# Patient Record
Sex: Female | Born: 1945 | Race: Black or African American | Hispanic: No | Marital: Married
Health system: Southern US, Community
[De-identification: ages and names within clinical notes are randomized; demographics above are authoritative.]

## PROBLEM LIST (undated history)

## (undated) DIAGNOSIS — I10 Essential (primary) hypertension: Secondary | ICD-10-CM

## (undated) HISTORY — DX: Essential (primary) hypertension: I10

## (undated) HISTORY — PX: ABDOMINAL HYSTERECTOMY: SHX81

## (undated) HISTORY — PX: GALLBLADDER SURGERY: SHX652

---

## 2003-07-25 ENCOUNTER — Other Ambulatory Visit: Payer: Self-pay

## 2004-11-25 ENCOUNTER — Ambulatory Visit: Payer: Self-pay | Admitting: Family Medicine

## 2005-11-28 ENCOUNTER — Ambulatory Visit: Payer: Self-pay | Admitting: Family Medicine

## 2007-09-04 ENCOUNTER — Emergency Department: Payer: Self-pay | Admitting: Emergency Medicine

## 2008-03-24 ENCOUNTER — Ambulatory Visit: Payer: Self-pay | Admitting: Family Medicine

## 2009-04-13 ENCOUNTER — Ambulatory Visit: Payer: Self-pay | Admitting: Family Medicine

## 2010-04-27 ENCOUNTER — Ambulatory Visit: Payer: Self-pay | Admitting: Family

## 2011-04-23 ENCOUNTER — Ambulatory Visit: Payer: Self-pay | Admitting: Internal Medicine

## 2011-04-23 LAB — WET PREP, GENITAL

## 2011-04-23 LAB — URINALYSIS, COMPLETE
Bilirubin,UR: NEGATIVE
Blood: NEGATIVE
Glucose,UR: NEGATIVE mg/dL (ref 0–75)
Ketone: NEGATIVE
Leukocyte Esterase: NEGATIVE
Nitrite: NEGATIVE
Ph: 5 (ref 4.5–8.0)
Specific Gravity: 1.01 (ref 1.003–1.030)

## 2011-04-25 LAB — URINE CULTURE

## 2011-05-04 ENCOUNTER — Ambulatory Visit: Payer: Self-pay | Admitting: Family

## 2011-05-18 ENCOUNTER — Emergency Department: Payer: Self-pay | Admitting: *Deleted

## 2011-05-18 LAB — URINALYSIS, COMPLETE
Bilirubin,UR: NEGATIVE
Blood: NEGATIVE
Leukocyte Esterase: NEGATIVE
Ph: 6 (ref 4.5–8.0)
RBC,UR: 1 /HPF (ref 0–5)
Specific Gravity: 1.004 (ref 1.003–1.030)
Squamous Epithelial: <1
WBC UR: 1 /HPF (ref 0–5)

## 2011-05-18 LAB — COMPREHENSIVE METABOLIC PANEL
Alkaline Phosphatase: 87 U/L (ref 50–136)
Anion Gap: 11 (ref 7–16)
BUN: 10 mg/dL (ref 7–18)
Chloride: 104 mmol/L (ref 98–107)
Co2: 28 mmol/L (ref 21–32)
Glucose: 91 mg/dL (ref 65–99)
Osmolality: 284 (ref 275–301)
Potassium: 4.3 mmol/L (ref 3.5–5.1)
SGOT(AST): 37 U/L (ref 15–37)
SGPT (ALT): 18 U/L
Total Protein: 8.6 g/dL — ABNORMAL HIGH (ref 6.4–8.2)

## 2011-05-18 LAB — CBC
HCT: 43.9 % (ref 35.0–47.0)
MCH: 27.7 pg (ref 26.0–34.0)
MCHC: 32.3 g/dL (ref 32.0–36.0)
RBC: 5.12 10*6/uL (ref 3.80–5.20)
RDW: 14.2 % (ref 11.5–14.5)
WBC: 6.4 10*3/uL (ref 3.6–11.0)

## 2011-05-18 LAB — TSH: Thyroid Stimulating Horm: 0.274 u[IU]/mL — ABNORMAL LOW

## 2011-05-18 LAB — TROPONIN I: Troponin-I: <0.02 ng/mL

## 2011-05-18 LAB — LIPASE, BLOOD: Lipase: 105 U/L (ref 73–393)

## 2011-07-09 ENCOUNTER — Emergency Department: Payer: Self-pay | Admitting: Unknown Physician Specialty

## 2011-07-09 LAB — COMPREHENSIVE METABOLIC PANEL
Alkaline Phosphatase: 84 U/L (ref 50–136)
Anion Gap: 7 (ref 7–16)
BUN: 9 mg/dL (ref 7–18)
Bilirubin,Total: 0.3 mg/dL (ref 0.2–1.0)
Chloride: 105 mmol/L (ref 98–107)
Creatinine: 0.92 mg/dL (ref 0.60–1.30)
EGFR (African American): >60
EGFR (Non-African Amer.): >60
Glucose: 79 mg/dL (ref 65–99)
Potassium: 3.2 mmol/L — ABNORMAL LOW (ref 3.5–5.1)
SGPT (ALT): 16 U/L
Sodium: 145 mmol/L (ref 136–145)

## 2011-07-09 LAB — CK TOTAL AND CKMB (NOT AT ARMC)
CK, Total: 118 U/L (ref 21–215)
CK-MB: 1 ng/mL (ref 0.5–3.6)

## 2011-07-09 LAB — CBC
HCT: 44 % (ref 35.0–47.0)
MCH: 27.8 pg (ref 26.0–34.0)
WBC: 5.5 10*3/uL (ref 3.6–11.0)

## 2011-07-09 LAB — TROPONIN I: Troponin-I: <0.02 ng/mL

## 2012-05-18 ENCOUNTER — Ambulatory Visit: Payer: Self-pay | Admitting: Internal Medicine

## 2013-05-23 ENCOUNTER — Ambulatory Visit: Payer: Self-pay | Admitting: Internal Medicine

## 2013-12-02 ENCOUNTER — Ambulatory Visit (INDEPENDENT_AMBULATORY_CARE_PROVIDER_SITE_OTHER): Payer: 59

## 2013-12-02 ENCOUNTER — Encounter: Payer: Self-pay | Admitting: Podiatry

## 2013-12-02 ENCOUNTER — Ambulatory Visit (INDEPENDENT_AMBULATORY_CARE_PROVIDER_SITE_OTHER): Payer: 59 | Admitting: Podiatry

## 2013-12-02 VITALS — BP 166/89 | HR 60 | Resp 16 | Ht 63.0 in | Wt 155.0 lb

## 2013-12-02 DIAGNOSIS — M722 Plantar fascial fibromatosis: Secondary | ICD-10-CM

## 2013-12-02 MED ORDER — METHYLPREDNISOLONE (PAK) 4 MG PO TABS: ORAL_TABLET | ORAL | Status: AC

## 2013-12-02 MED ORDER — MELOXICAM 15 MG PO TABS
15.0000 mg | ORAL_TABLET | Freq: Every day | ORAL | Status: AC
Start: 2013-12-02 — End: ?

## 2013-12-02 NOTE — Progress Notes (Signed)
   Subjective:    Patient ID: Nicole Griffith, female    DOB: [birth year], 68 y.o.   MRN: [id number]  HPI Comments: Right plantar heel pain, real sore when walking on it, its making my ankle swell . It has been going on for about a month now   Foot Pain Associated symptoms include abdominal pain.      Review of Systems  Eyes: Positive for redness and itching.  Cardiovascular: Positive for palpitations.  Gastrointestinal: Positive for abdominal pain and constipation.  Genitourinary: Positive for urgency.  All other systems reviewed and are negative.      Objective:   Physical Exam: I have reviewed her past medical history medications allergies surgeries social history and review of systems. Pulses are strongly palpable bilateral. Neurologic sensorium is intact per since once the monofilament. Deep tendon reflexes are intact bilateral muscle strength is 5 over 5 dorsiflexors plantar flexors inverters everters all intrinsic musculature is intact. Orthopedic evaluation demonstrates all joints distal to the ankle a full range of motion without crepitation. She has pain on palpation medial continued tubercle of her right heel. Radiographic evaluation does demonstrate soft tissue increase in density at the plantar fascial calcaneal insertion site of the right heel. Cutaneous evaluation demonstrates supple well hydrated cutis no erythema edema cellulitis drainage or odor.        Assessment & Plan:  Assessment: Plantar fasciitis right foot.  Plan: Discussed etiology quality conservative versus surgical therapies. Injected Kenalog and local anesthetic to the point of maximal tenderness of the right heel today. Medrol Dosepak to be followed by meloxicam. Night splint and a plantar fascial brace were dispensed. We discussed appropriate shoe gear stretching exercises ice therapy shoe gear modifications. We discussed the etiology pathology conservative versus surgical therapies. I will followup with  her in one month.

## 2013-12-02 NOTE — Patient Instructions (Signed)

## 2013-12-30 ENCOUNTER — Ambulatory Visit: Payer: 59 | Admitting: Podiatry

## 2014-05-28 ENCOUNTER — Ambulatory Visit
Admit: 2014-05-28 | Disposition: A | Discharge status: Home / Self Care | Payer: Self-pay | Attending: Internal Medicine | Admitting: Internal Medicine

## 2014-06-04 ENCOUNTER — Other Ambulatory Visit: Payer: Self-pay | Admitting: Internal Medicine

## 2014-06-04 DIAGNOSIS — N63 Unspecified lump in unspecified breast: Secondary | ICD-10-CM

## 2014-06-16 ENCOUNTER — Ambulatory Visit
Admission: RE | Admit: 2014-06-16 | Discharge: 2014-06-16 | Disposition: A | Discharge status: Home / Self Care | Payer: Medicare Other | Source: Ambulatory Visit | Attending: Internal Medicine | Admitting: Internal Medicine

## 2014-06-16 ENCOUNTER — Ambulatory Visit: Admission: RE | Admit: 2014-06-16 | Payer: Medicare Other | Source: Ambulatory Visit

## 2014-06-16 DIAGNOSIS — R928 Other abnormal and inconclusive findings on diagnostic imaging of breast: Secondary | ICD-10-CM | POA: Insufficient documentation

## 2014-06-16 DIAGNOSIS — N63 Unspecified lump in unspecified breast: Secondary | ICD-10-CM

## 2015-05-22 ENCOUNTER — Other Ambulatory Visit: Payer: Self-pay | Admitting: Family Medicine

## 2015-05-22 DIAGNOSIS — Z1231 Encounter for screening mammogram for malignant neoplasm of breast: Secondary | ICD-10-CM

## 2015-06-01 ENCOUNTER — Ambulatory Visit: Admission: RE | Admit: 2015-06-01 | Payer: Medicare Other | Source: Ambulatory Visit

## 2015-06-11 ENCOUNTER — Ambulatory Visit
Admission: RE | Admit: 2015-06-11 | Discharge: 2015-06-11 | Disposition: A | Discharge status: Home / Self Care | Payer: Medicare Other | Source: Ambulatory Visit | Attending: Family Medicine | Admitting: Family Medicine

## 2015-06-11 DIAGNOSIS — Z1231 Encounter for screening mammogram for malignant neoplasm of breast: Secondary | ICD-10-CM | POA: Diagnosis not present

## 2015-07-23 IMAGING — MG MM DIGITAL SCREENING BILAT W/ CAD
1 series · 6 of 6 positions shown · non-contrast
Comparison: Previous exam(s).

CLINICAL DATA: Screening.

EXAM:
DIGITAL SCREENING BILATERAL MAMMOGRAM WITH CAD

[R CC · right · 6 of 6 slices shown]
[im 1/6]
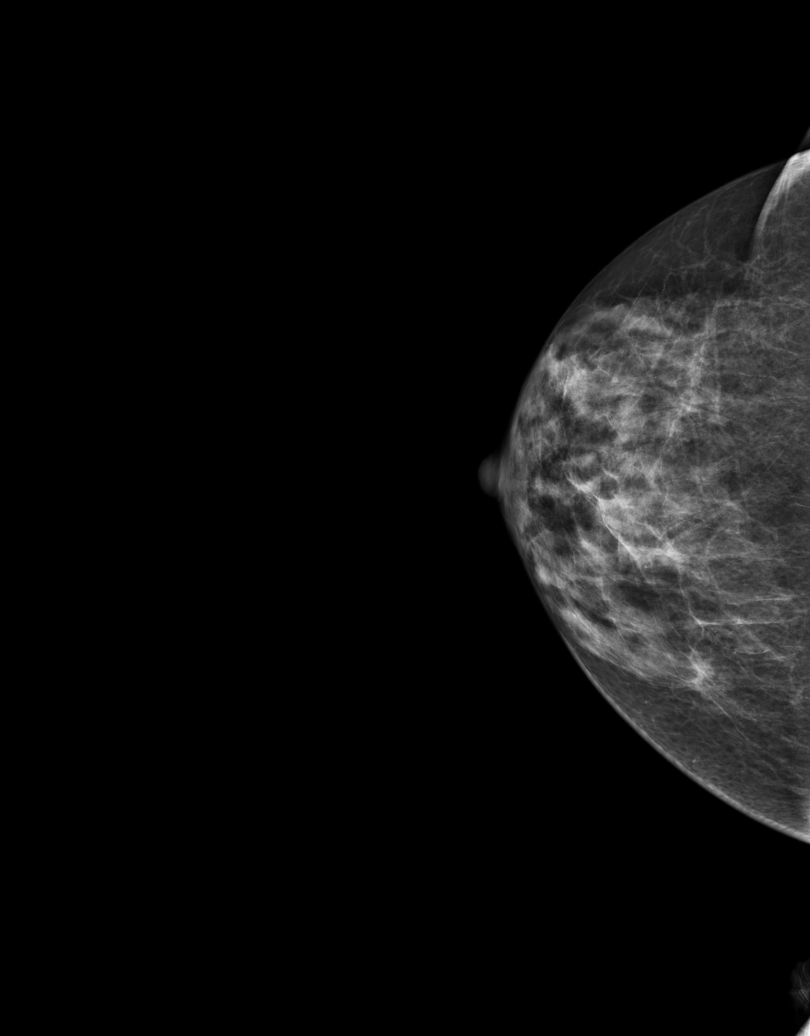
[im 2/6]
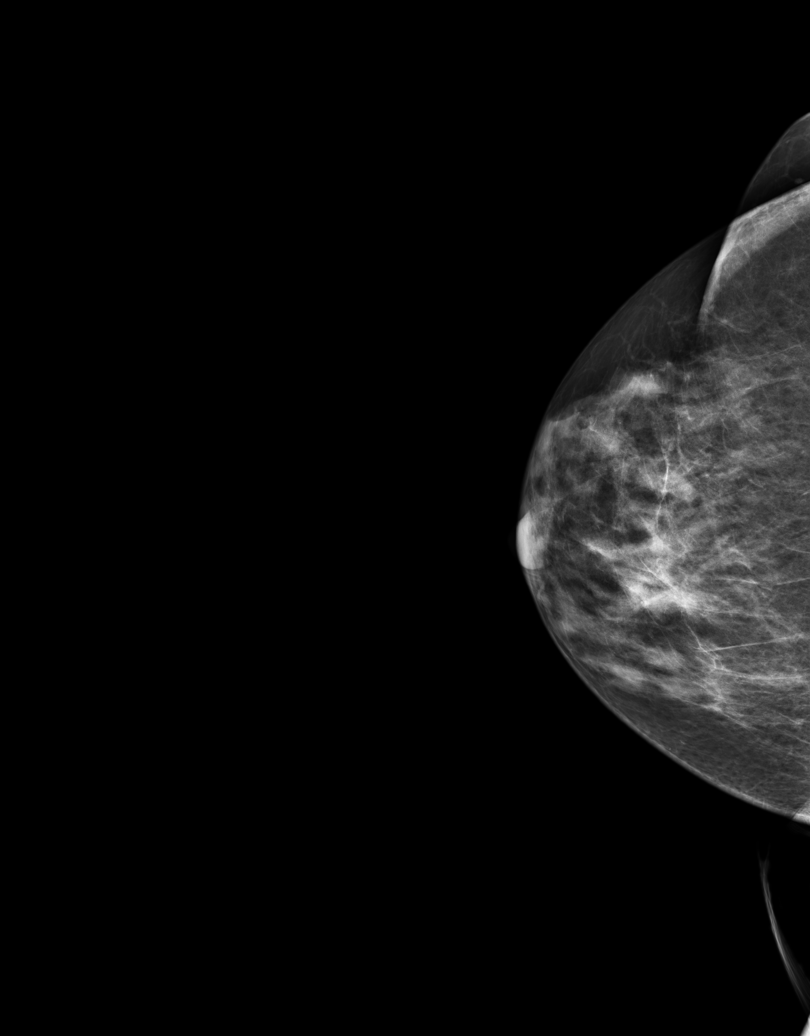
[im 3/6]
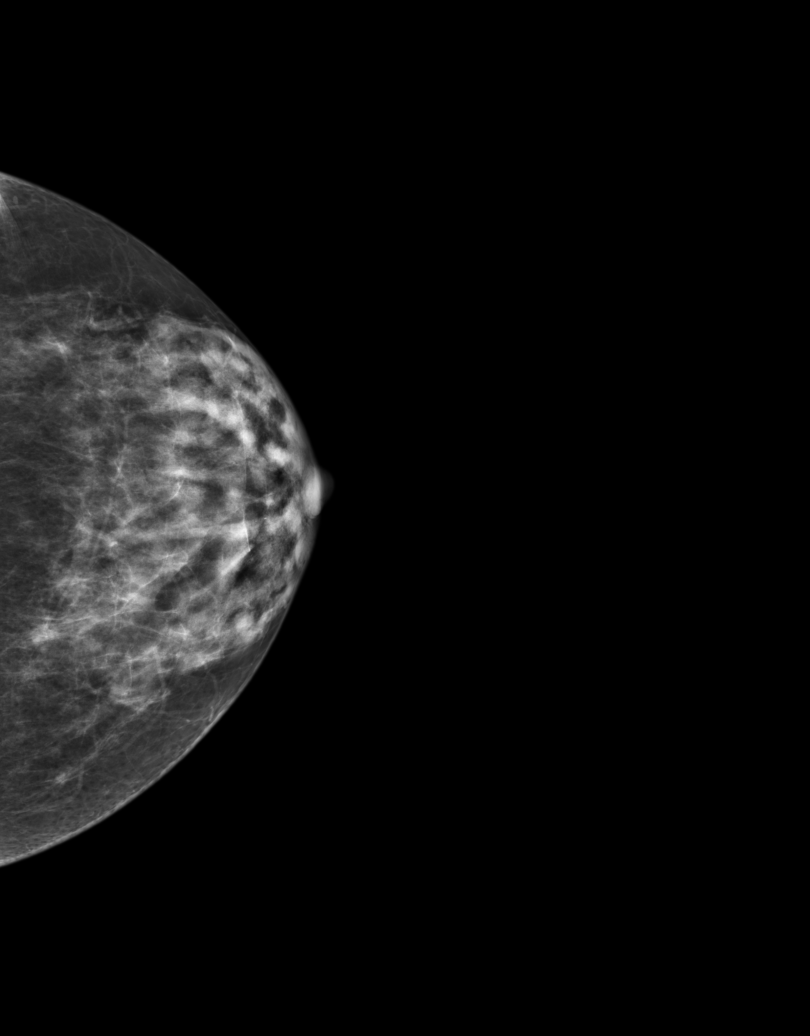
[im 4/6]
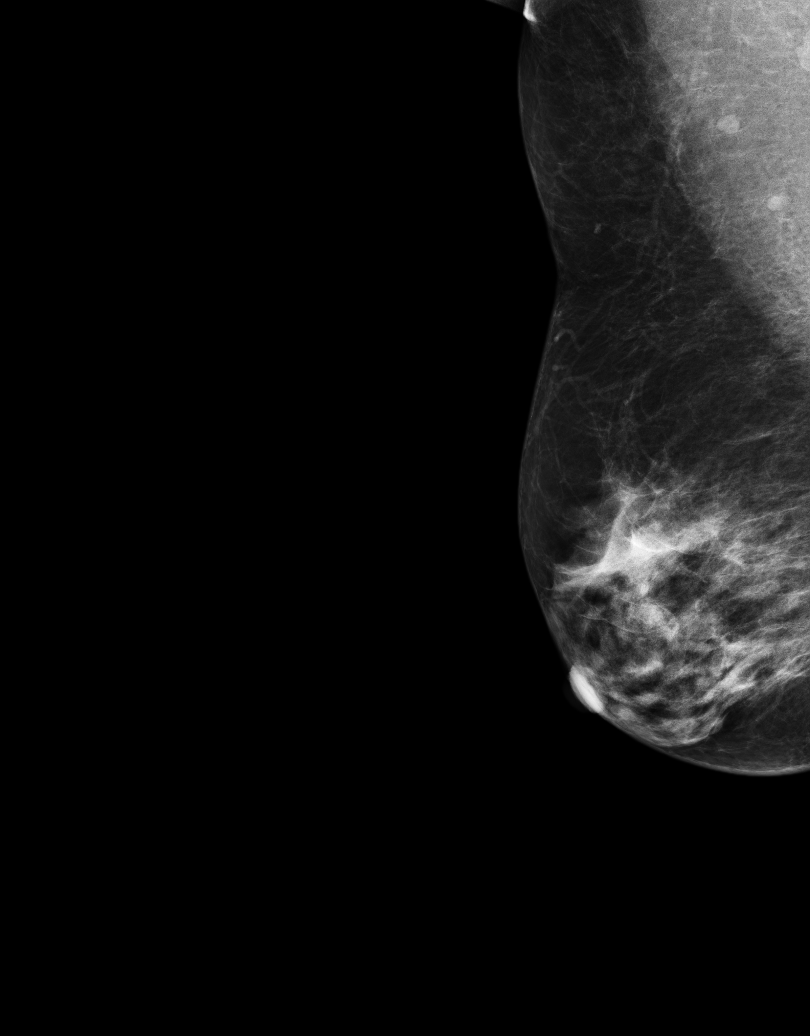
[im 5/6]
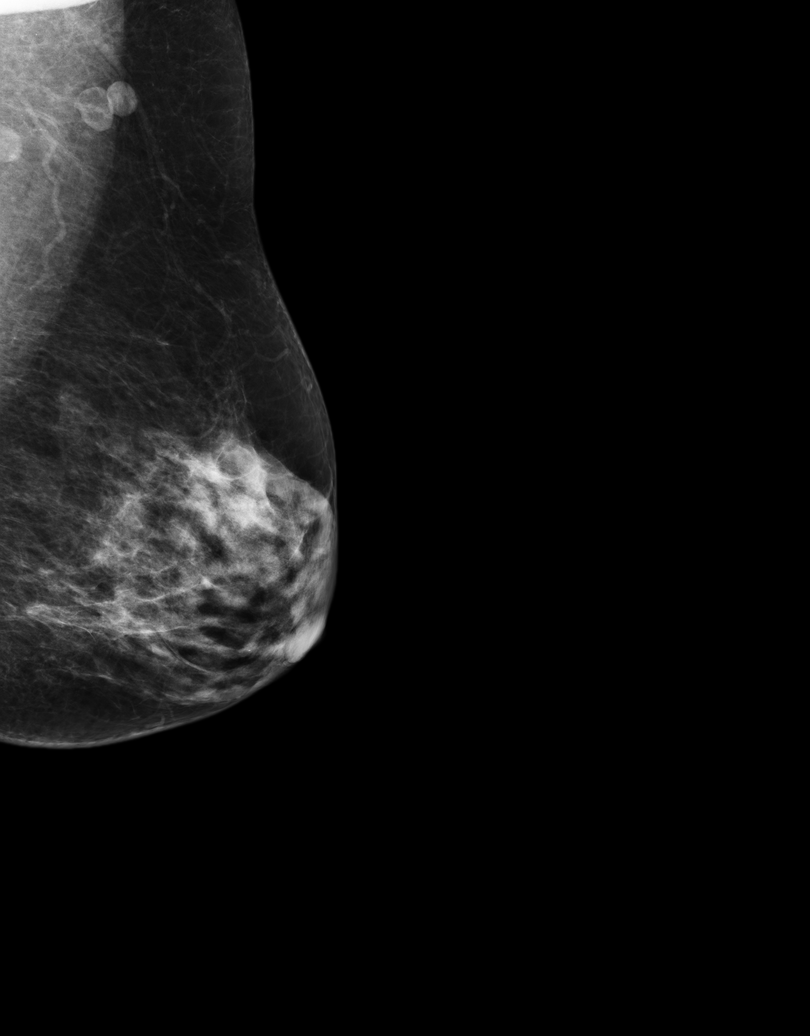
[im 6/6]
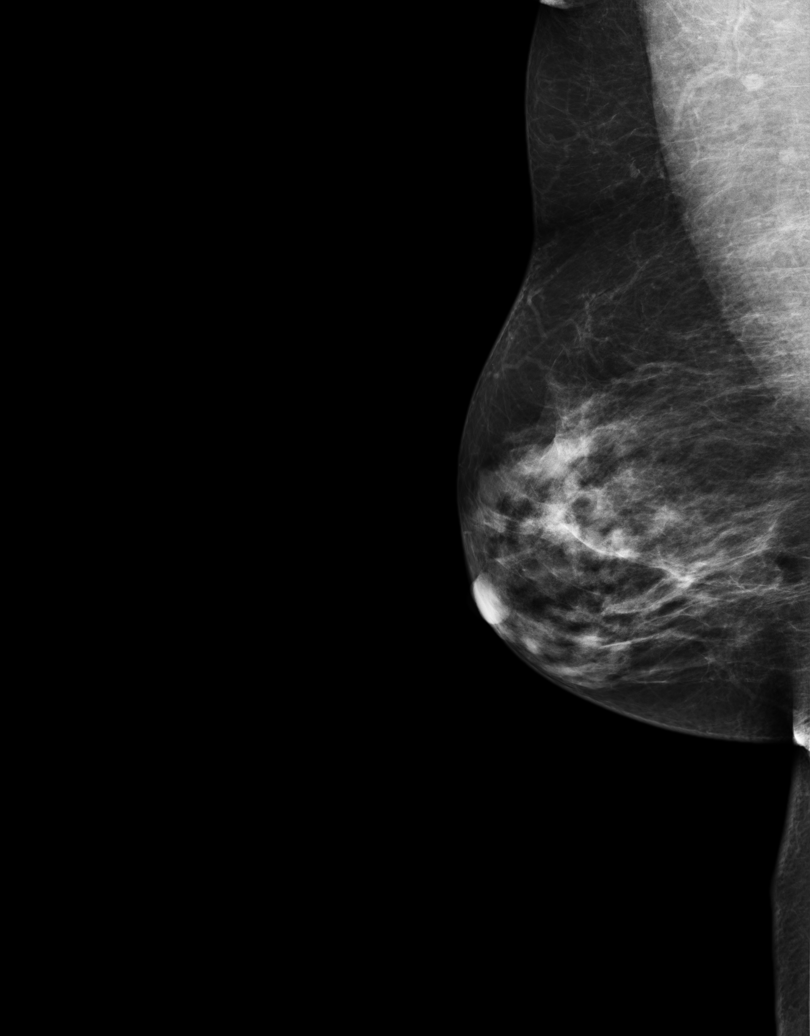

[6 of 6 positions shown; findings below may reference images not displayed]

ACR Breast Density Category c: The breast tissue is heterogeneously
dense, which may obscure small masses.
FINDINGS: In the left breast, a possible mass warrants further evaluation. In
the right breast, no findings suspicious for malignancy.

Images were processed with CAD.
IMPRESSION: Further evaluation is suggested for possible mass in the left
breast.

RECOMMENDATION:
Diagnostic mammogram and possibly ultrasound of the left breast.
(Code:[id number])

The patient will be contacted regarding the findings, and additional
imaging will be scheduled.

BI-RADS CATEGORY  0: Incomplete. Need additional imaging evaluation
and/or prior mammograms for comparison.

## 2016-08-05 IMAGING — MG MM DIGITAL SCREENING BILAT W/ CAD
1 series · 5 of 5 positions shown · non-contrast
Comparison: Previous exam(s).

CLINICAL DATA: Screening.

EXAM:
DIGITAL SCREENING BILATERAL MAMMOGRAM WITH CAD

[R CC · right · 5 of 5 slices shown]
[im 1/5]
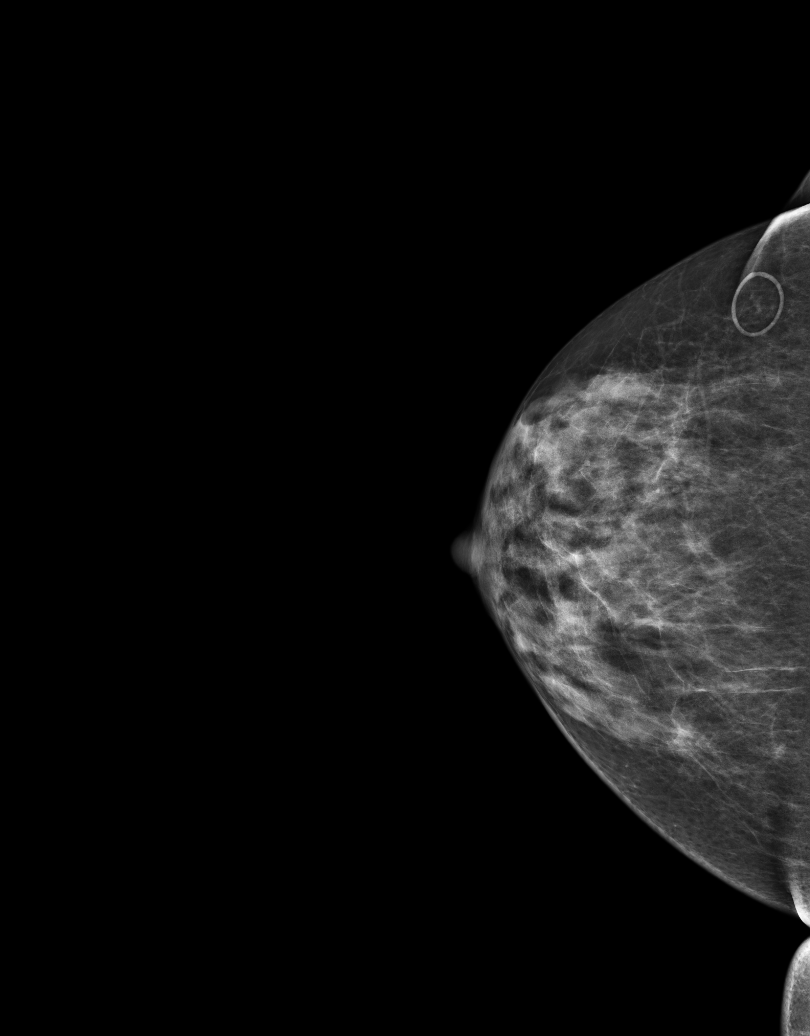
[im 2/5]
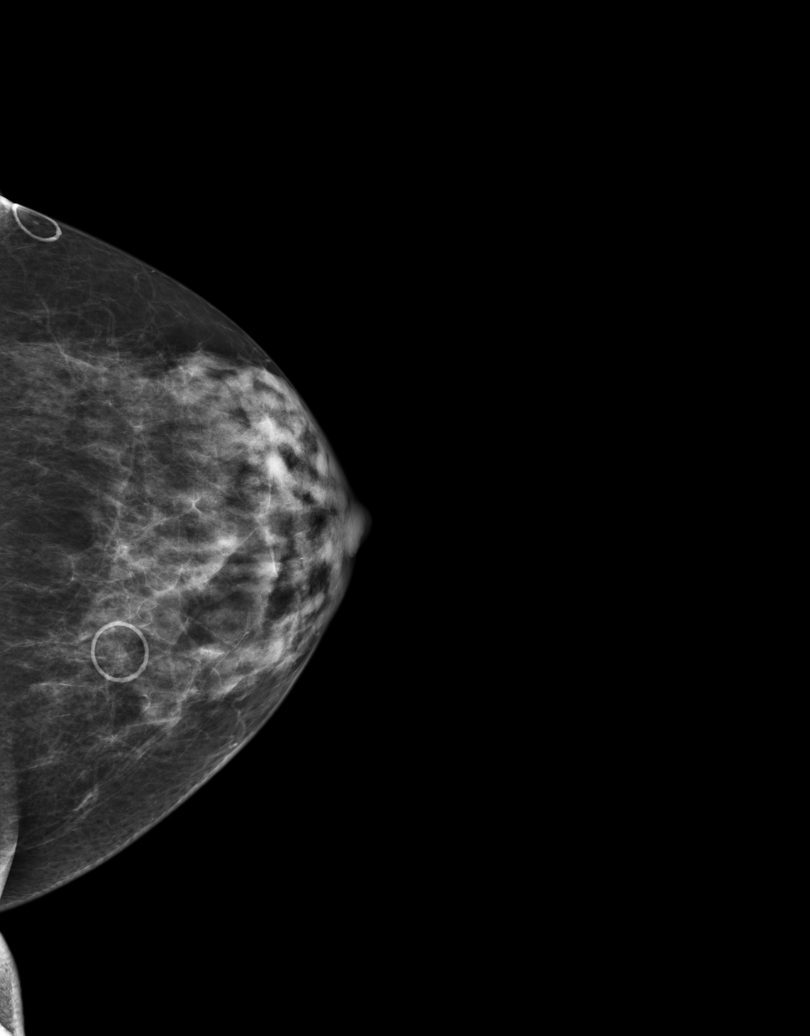
[im 3/5]
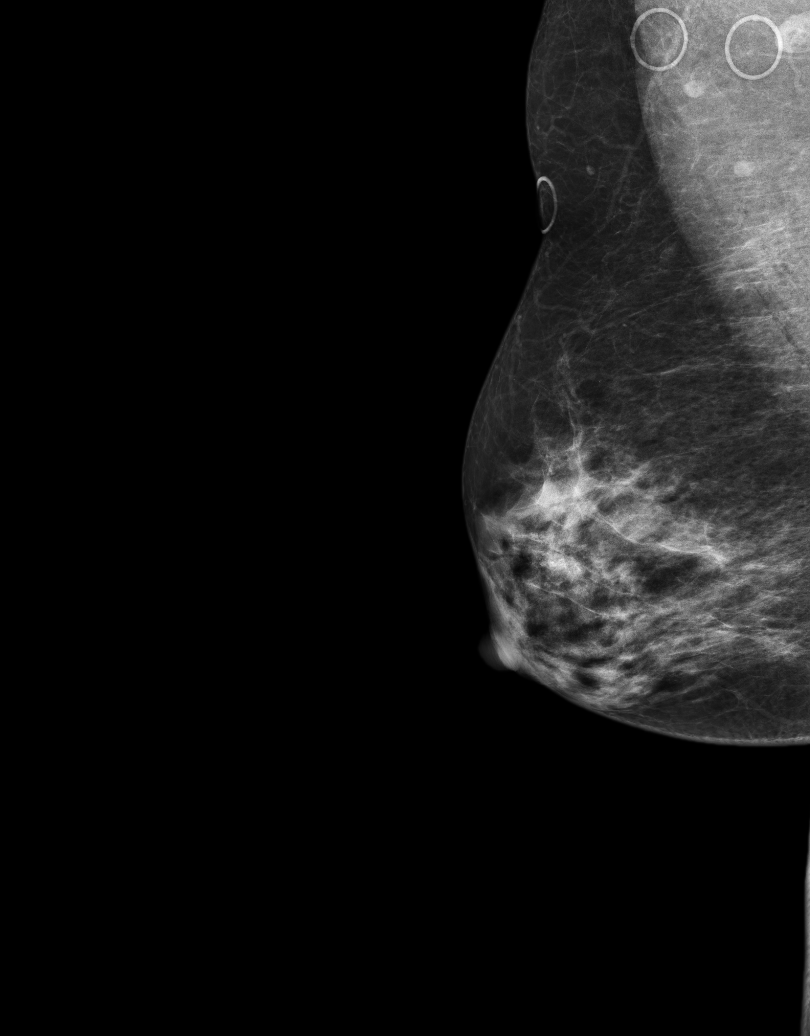
[im 4/5]
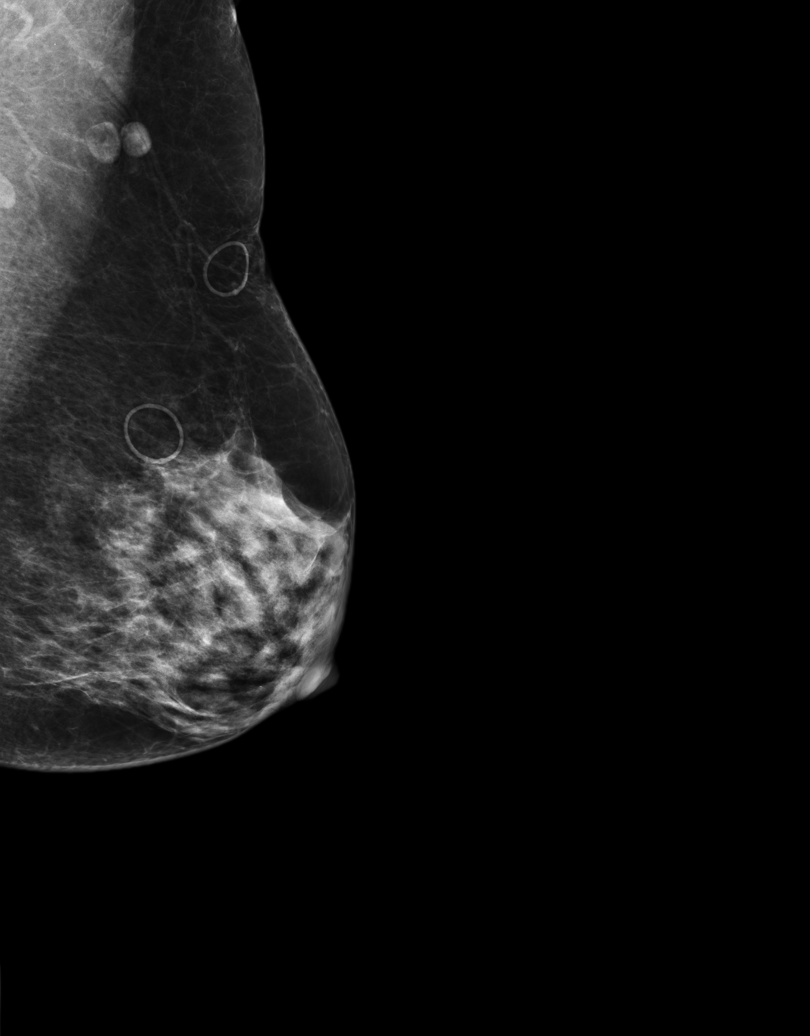
[im 5/5]
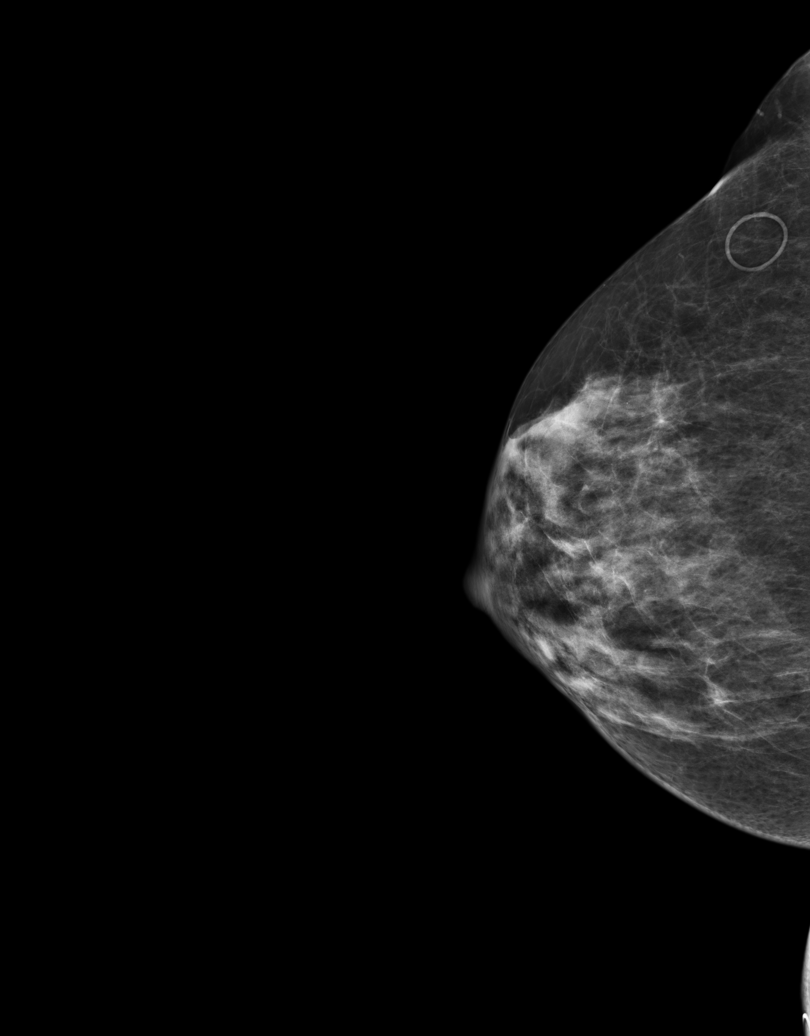

[5 of 5 positions shown; findings below may reference images not displayed]

ACR Breast Density Category c: The breast tissue is heterogeneously
dense, which may obscure small masses.
FINDINGS: There are no findings suspicious for malignancy. Images were
processed with CAD.
IMPRESSION: No mammographic evidence of malignancy. A result letter of this
screening mammogram will be mailed directly to the patient.

RECOMMENDATION:
Screening mammogram in one year. (Code:[id number])

BI-RADS CATEGORY  1: Negative.

## 2017-05-22 ENCOUNTER — Other Ambulatory Visit: Payer: Self-pay | Admitting: Family Medicine

## 2017-05-22 DIAGNOSIS — Z1231 Encounter for screening mammogram for malignant neoplasm of breast: Secondary | ICD-10-CM

## 2017-05-30 ENCOUNTER — Ambulatory Visit
Admission: RE | Admit: 2017-05-30 | Discharge: 2017-05-30 | Disposition: A | Discharge status: Home / Self Care | Payer: Medicare Other | Source: Ambulatory Visit | Attending: Family Medicine | Admitting: Family Medicine

## 2017-05-30 DIAGNOSIS — Z1231 Encounter for screening mammogram for malignant neoplasm of breast: Secondary | ICD-10-CM | POA: Insufficient documentation

## 2018-06-04 ENCOUNTER — Other Ambulatory Visit: Payer: Self-pay | Admitting: Family Medicine

## 2018-06-04 DIAGNOSIS — Z1231 Encounter for screening mammogram for malignant neoplasm of breast: Secondary | ICD-10-CM

## 2018-08-27 ENCOUNTER — Inpatient Hospital Stay: Admission: RE | Admit: 2018-08-27 | Payer: Medicare Other | Source: Ambulatory Visit

## 2024-02-13 ENCOUNTER — Ambulatory Visit: Admitting: Physical Therapy

## 2024-02-19 ENCOUNTER — Encounter: Payer: Self-pay | Admitting: Physical Therapy

## 2024-02-19 ENCOUNTER — Ambulatory Visit: Attending: Family Medicine

## 2024-02-19 DIAGNOSIS — R2681 Unsteadiness on feet: Secondary | ICD-10-CM | POA: Insufficient documentation

## 2024-02-19 DIAGNOSIS — M6281 Muscle weakness (generalized): Secondary | ICD-10-CM | POA: Diagnosis present

## 2024-02-19 NOTE — Therapy (Signed)
 " OUTPATIENT PHYSICAL THERAPY LOWER EXTREMITY EVALUATION   Patient Name: Nicole Griffith MRN: [id number] DOB:[birth year], 79 y.o., female Today's Date: 02/19/2024  END OF SESSION:  PT End of Session - 02/19/24 1126     Visit Number 1    Number of Visits 17    Date for Recertification  04/15/24    PT Start Time 1130    PT Stop Time 1156    PT Time Calculation (min) 26 min    Activity Tolerance Patient tolerated treatment well    Behavior During Therapy WFL for tasks assessed/performed          Past Medical History:  Diagnosis Date   Hypertension    Past Surgical History:  Procedure Laterality Date   ABDOMINAL HYSTERECTOMY     GALLBLADDER SURGERY     There are no active problems to display for this patient.   PCP: Kingston Mai, MD  REFERRING PROVIDER: Deloria Therisa BIRCH, MD   REFERRING DIAG: [phone] (ICD-10-CM) - Pain in left leg  THERAPY DIAG:  Muscle weakness (generalized)  Unsteadiness on feet  Rationale for Evaluation and Treatment: Rehabilitation  ONSET DATE: >1 month   SUBJECTIVE:   SUBJECTIVE STATEMENT: Patient was admitted to [facility] and found to have L knee meniscal tear and herniated muscle pull in L lower leg. She received HH therapy prior to coming to OPPT which she said this seemed to help and pain is better. She doesn't quite understand why she is coming to OPPT for more therapy as she feels her L knee is better and less pain.   PERTINENT HISTORY: Per PT note from 01/13/24 hospitalization, Nicole Griffith is a 79 y.o. female with PMH of HTN, GERD, and osteoarthritis presenting for ~2 weeks of persistent tightness and pain radiating down the her posterior left lower extremity localized to her posterior left knee. Per MRI results, pt with medial meniscal tear.  PAIN:  Are you having pain? No: NPRS scale: 0/10; No pain over the past week.    PRECAUTIONS: None  RED FLAGS: None   WEIGHT BEARING RESTRICTIONS: No  FALLS:  Has  patient fallen in last 6 months? No  LIVING ENVIRONMENT: Lives with: lives with their spouse Lives in: House/apartment  Has following equipment at home: Single point cane and Environmental Consultant - 2 wheeled  OCCUPATION: Retired   PLOF: Independent  PATIENT GOALS: to get stronger   OBJECTIVE:  Note: Objective measures were completed at Evaluation unless otherwise noted.  DIAGNOSTIC FINDINGS:   EXAM: MRI LOWER EXTREMITY JOINT LEFT WO CONTRAST IMPRESSION: Left knee: 1. Horizontal oblique tear of the body of the medial meniscus, with small meniscal fragment extending to the meniscotibial recess. 2. Mild tendinosis of the origin of the medial gastrocnemius. 3. Mild tricompartmental chondrosis. Tiny popliteal cyst.  PATIENT SURVEYS:  LEFS  Extreme difficulty/unable (0), Quite a bit of difficulty (1), Moderate difficulty (2), Little difficulty (3), No difficulty (4) Survey date:   02/19/24  Any of your usual work, housework or school activities 3  2. Usual hobbies, recreational or sporting activities 3  3. Getting into/out of the bath 2  4. Walking between rooms 2  5. Putting on socks/shoes 4  6. Squatting  3  7. Lifting an object, like a bag of groceries from the floor 1  8. Performing light activities around your home 1  9. Performing heavy activities around your home 1  10. Getting into/out of a car 3  11. Walking 2 blocks 3  12. Walking  1 mile 1  13. Going up/down 10 stairs (1 flight) 1  14. Standing for 1 hour 1  15.  sitting for 1 hour 2  16. Running on even ground 0  17. Running on uneven ground 0  18. Making sharp turns while running fast 0  19. Hopping  0  20. Rolling over in bed 2  Score total:  33/80     COGNITION: Overall cognitive status: Within functional limits for tasks assessed     SENSATION: WFL - does have hx of peripheral neuropathy per patient   MUSCLE LENGTH: Not tested  POSTURE: No Significant postural limitations  PALPATION: No TTP   LOWER  EXTREMITY ROM:  WFL   LOWER EXTREMITY MMT:  MMT Right eval Left eval  Hip flexion 4 4  Hip extension    Hip abduction 4 4  Hip adduction 4+ 4+  Hip internal rotation    Hip external rotation    Knee flexion 4+ 4+  Knee extension 4+ 4+  Ankle dorsiflexion 4+ 4+  Ankle plantarflexion    Ankle inversion    Ankle eversion     (Blank rows = not tested)  LOWER EXTREMITY SPECIAL TESTS:  Known L knee meniscal tear   FUNCTIONAL TESTS:  5 times sit to stand: 14.81 seconds with no UE support  6 minute walk test: to be completed visit #2  10 meter walk test: to be completed visit #2   GAIT: Distance walked: 11'  Assistive device utilized: None Level of assistance: Modified independence Comments: slight R lateral lean, decreased stance time on L                                                                                                                                TREATMENT DATE: 02/19/2024    PATIENT EDUCATION:  Education details: POC, goals, role of OPPT  Person educated: Patient Education method: Medical Illustrator Education comprehension: verbalized understanding and returned demonstration  HOME EXERCISE PROGRAM: To be completed visit #2   ASSESSMENT:  CLINICAL IMPRESSION: Patient is a 79 y.o. female who was seen today for physical therapy evaluation and treatment for L knee pain with known meniscal tear. Patient arrives 15 minutes late to evaluation so adjusted evaluation within time restraints. Patient demonstrates BLE weakness, impaired balance, and decreased activity tolerance. Hip weakness is majority of her weakness. Discussed role of OPPT compared to HHPT with patient verbalizing understanding and wanting to continue with OPPT as she was unsure of reasoning why she was here. Overall, patient wanting to focus on strength and balance. Patient will benefit from skilled PT interventions to address listed impairments to improve quality of life and reduce  fall risk.   OBJECTIVE IMPAIRMENTS: Abnormal gait, decreased activity tolerance, decreased balance, decreased endurance, difficulty walking, and decreased strength.   ACTIVITY LIMITATIONS: lifting, bending, squatting, stairs, and locomotion level  PARTICIPATION LIMITATIONS: cleaning, laundry, community activity, and yard work  PERSONAL FACTORS:  Age, Behavior pattern, Past/current experiences, and Time since onset of injury/illness/exacerbation are also affecting patient's functional outcome.   REHAB POTENTIAL: Good  CLINICAL DECISION MAKING: Stable/uncomplicated  EVALUATION COMPLEXITY: Low   GOALS: Goals reviewed with patient? Yes  SHORT TERM GOALS: Target date: 03/18/2024  Patient will be independent in HEP to improve strength/mobility for better functional independence with ADLs.  Baseline: 02/19/24: to be initiated at visit #2 - has HHPT HEP Goal status: INITIAL  LONG TERM GOALS: Target date: 04/15/2024  Patient will increase lower extremity functional scale by 9 points to demonstrate improved functional mobility and increased tolerance with ADLs.   Baseline: 02/19/24: 33/80 Goal status: INITIAL  2.  Patient will increase BLE gross strength to 4+/5 as to improve functional strength for independent gait, increased standing tolerance and increased ADL ability.  Baseline: 02/19/24: see above  Goal status: INITIAL  3.  Patient will improve five times sit to stand test by 3 seconds indicating an increased LE strength and improved balance.  Baseline: 02/19/24: 14.81 seconds with no UE support  Goal status: INITIAL  4.  Patient will improve by 11m (164') in order to demonstrate clinically significant improvement in cardiopulmonary endurance and community ambulation  Baseline: 02/19/24: to be completed visit #2 ` Goal status: INITIAL  5.  Patient will increase 10 meter walk test to >1.75m/s as to improve gait speed for better community ambulation and to reduce fall risk.  Baseline:  02/19/24: to be completed visit #2  Goal status: INITIAL   PLAN:  PT FREQUENCY: 1-2x/week  PT DURATION: 8 weeks  PLANNED INTERVENTIONS: 97164- PT Re-evaluation, 97750- Physical Performance Testing, 97110-Therapeutic exercises, 97530- Therapeutic activity, 97112- Neuromuscular re-education, 97535- Self Care, 02859- Manual therapy, [phone]- Gait training, [phone]- Electrical stimulation (unattended), [phone]- Electrical stimulation (manual), 20560 (1-2 muscles), 20561 (3+ muscles)- Dry Needling, Patient/Family education, Balance training, Stair training, Joint mobilization, Joint manipulation, Cryotherapy, and Moist heat  PLAN FOR NEXT SESSION: Assess balance (dynamic and static), , ,    Maryanne Finder, PT, DPT Physical Therapist - [facility]  [facility] 02/19/2024, 12:39 PM  "

## 2024-02-21 ENCOUNTER — Encounter: Payer: Self-pay | Admitting: Physical Therapy

## 2024-02-21 ENCOUNTER — Ambulatory Visit: Admitting: Physical Therapy

## 2024-02-21 DIAGNOSIS — M6281 Muscle weakness (generalized): Secondary | ICD-10-CM

## 2024-02-21 DIAGNOSIS — R2681 Unsteadiness on feet: Secondary | ICD-10-CM

## 2024-02-21 NOTE — Therapy (Signed)
 " OUTPATIENT PHYSICAL THERAPY LOWER EXTREMITY TREATMENT   Patient Name: Nicole Griffith MRN: [id number] DOB:[birth year], 79 y.o., female Today's Date: 02/21/2024  END OF SESSION:  PT End of Session - 02/21/24 1059     Visit Number 2    Number of Visits 17    Date for Recertification  04/15/24    PT Start Time 1103    PT Stop Time 1155    PT Time Calculation (min) 52 min    Equipment Utilized During Treatment Gait belt    Activity Tolerance Patient tolerated treatment well    Behavior During Therapy WFL for tasks assessed/performed           Past Medical History:  Diagnosis Date   Hypertension    Past Surgical History:  Procedure Laterality Date   ABDOMINAL HYSTERECTOMY     GALLBLADDER SURGERY     There are no active problems to display for this patient.   PCP: Kingston Mai, MD  REFERRING PROVIDER: Deloria Therisa BIRCH, MD   REFERRING DIAG: [phone] (ICD-10-CM) - Pain in left leg  THERAPY DIAG:  Muscle weakness (generalized)  Unsteadiness on feet  Rationale for Evaluation and Treatment: Rehabilitation  ONSET DATE: >1 month   SUBJECTIVE STATEMENT: Patient was admitted to [facility] and found to have L knee meniscal tear and herniated muscle pull in L lower leg. She received HH therapy prior to coming to OPPT which she said this seemed to help and pain is better. She doesn't quite understand why she is coming to OPPT for more therapy as she feels her L knee is better and less pain.   PERTINENT HISTORY: Per PT note from 01/13/24 hospitalization, Nicole Griffith is a 79 y.o. female with PMH of HTN, GERD, and osteoarthritis presenting for ~2 weeks of persistent tightness and pain radiating down the her posterior left lower extremity localized to her posterior left knee. Per MRI results, pt with medial meniscal tear.  PAIN:  Are you having pain? No: NPRS scale: 0/10; No pain over the past week.    PRECAUTIONS: None  RED FLAGS: None   WEIGHT BEARING  RESTRICTIONS: No  FALLS:  Has patient fallen in last 6 months? No  LIVING ENVIRONMENT: Lives with: lives with their spouse Lives in: House/apartment  Has following equipment at home: Single point cane and Environmental Consultant - 2 wheeled  OCCUPATION: Retired   PLOF: Independent  PATIENT GOALS: to get stronger   OBJECTIVE:  Note: Objective measures were completed at Evaluation unless otherwise noted.  DIAGNOSTIC FINDINGS:   EXAM: MRI LOWER EXTREMITY JOINT LEFT WO CONTRAST IMPRESSION: Left knee: 1. Horizontal oblique tear of the body of the medial meniscus, with small meniscal fragment extending to the meniscotibial recess. 2. Mild tendinosis of the origin of the medial gastrocnemius. 3. Mild tricompartmental chondrosis. Tiny popliteal cyst.  PATIENT SURVEYS:  LEFS  Extreme difficulty/unable (0), Quite a bit of difficulty (1), Moderate difficulty (2), Little difficulty (3), No difficulty (4) Survey date:   02/19/24  Any of your usual work, housework or school activities 3  2. Usual hobbies, recreational or sporting activities 3  3. Getting into/out of the bath 2  4. Walking between rooms 2  5. Putting on socks/shoes 4  6. Squatting  3  7. Lifting an object, like a bag of groceries from the floor 1  8. Performing light activities around your home 1  9. Performing heavy activities around your home 1  10. Getting into/out of a car 3  11.  Walking 2 blocks 3  12. Walking 1 mile 1  13. Going up/down 10 stairs (1 flight) 1  14. Standing for 1 hour 1  15.  sitting for 1 hour 2  16. Running on even ground 0  17. Running on uneven ground 0  18. Making sharp turns while running fast 0  19. Hopping  0  20. Rolling over in bed 2  Score total:  33/80     COGNITION: Overall cognitive status: Within functional limits for tasks assessed     SENSATION: WFL - does have hx of peripheral neuropathy per patient   MUSCLE LENGTH: Not tested  POSTURE: No Significant postural  limitations  PALPATION: No TTP   LOWER EXTREMITY ROM:  WFL   LOWER EXTREMITY MMT:  MMT Right eval Left eval  Hip flexion 4 4  Hip extension    Hip abduction 4 4  Hip adduction 4+ 4+  Hip internal rotation    Hip external rotation    Knee flexion 4+ 4+  Knee extension 4+ 4+  Ankle dorsiflexion 4+ 4+  Ankle plantarflexion    Ankle inversion    Ankle eversion     (Blank rows = not tested)  LOWER EXTREMITY SPECIAL TESTS:  Known L knee meniscal tear   FUNCTIONAL TESTS:  5 times sit to stand: 14.81 seconds with no UE support  6 minute walk test: 770 ft 10 meter walk test: 10 meters/10.91 seconds = 0.92 m/s  (Updated 02/21/24)   GAIT: Distance walked: 50'  Assistive device utilized: None Level of assistance: Modified independence Comments: slight R lateral lean, decreased stance time on L                                                                                                                                TREATMENT DATE: 02/21/2024    SUBJECTIVE STATEMENT:   Pt reports history of knot along anterior tibial/shin region that has improved and pain along posterolateral knee/lateral hamstring region that has improved with exercise. Pt states that she wants to get stronger to continue working outside (picking up in her yard) and completing outdoor walking exercise on her own.    Neuromuscular Re-education - for improved sensory integration, static and dynamic postural control, equilibrium and non-equilibrium coordination as needed for negotiating home and community environment and stepping over obstacles  Romberg: EO 30 sec, EC 30 sec  BERG Balance Test (see below)  [facility] PT Assessment - 02/21/24 0001       Standardized Balance Assessment   Standardized Balance Assessment Berg Balance Test      Berg Balance Test   Sit to Stand Able to stand without using hands and stabilize independently    Standing Unsupported Able to stand safely 2 minutes    Sitting with  Back Unsupported but Feet Supported on Floor or Stool Able to sit safely and securely 2 minutes    Stand to Sit Sits safely with  minimal use of hands    Transfers Able to transfer safely, minor use of hands    Standing Unsupported with Eyes Closed Able to stand 10 seconds safely    Standing Unsupported with Feet Together Able to place feet together independently and stand 1 minute safely    From Standing, Reach Forward with Outstretched Arm Can reach forward >12 cm safely (5)    From Standing Position, Pick up Object from Floor Able to pick up shoe, needs supervision    From Standing Position, Turn to Look Behind Over each Shoulder Looks behind from both sides and weight shifts well    Turn 360 Degrees Able to turn 360 degrees safely but slowly    Standing Unsupported, Alternately Place Feet on Step/Stool Able to complete >2 steps/needs minimal assist    Standing Unsupported, One Foot in Front Able to take small step independently and hold 30 seconds    Standing on One Leg Tries to lift leg/unable to hold 3 seconds but remains standing independently    Total Score 44         // bars:  Toe tapping, 6-inch step; 1 x 10 alternating  Semitandem stance; x 30 sec on each side    Therapeutic Exercise - improved strength as needed to improve performance of CKC activities/functional movements and as needed for power production to prevent fall during episode of large postural perturbation   6-minute walk test: 770 ft   -PT/SPT CGA  10-meter gait speed: 10 meters/10.91 seconds = 0.92 m/s  -PT CGA  PATIENT EDUCATION: Discussed current findings, role of PT, plan of care. HEP updated and exercises established from home health were reviewed.      PATIENT EDUCATION:  Education details: see above Person educated: Patient Education method: Medical Illustrator Education comprehension: verbalized understanding and returned demonstration  HOME EXERCISE PROGRAM: Pt continuing home  health exercises (minisquat, standing march, standing hip extension and abduction, LAQ, supine ankle pumps with feet elevated for edema control) Access Code: [email or web address] URL: [email or web address] Date: 02/21/2024 Prepared by: Venetia Endo  Exercises - Alternating Step Taps with Counter Support  - 2 x daily - 7 x weekly - 2 sets - 10 reps - Standing Romberg to 1/2 Tandem Stance  - 2 x daily - 7 x weekly - 2-3 sets - 30sec hold    ASSESSMENT:  CLINICAL IMPRESSION: Today, we completed further testing as planned by evaluating therapist and updated patient's HEP to supplement exercises given by previous home health therapist. Patient has BERG score just shy of established cut-off score for fall risk and moderate deficit with 6-minute walk distance. Pt has remaining deficits in postural control, gait instability, and LE strength. Patient will benefit from skilled PT interventions to address listed impairments to improve quality of life and reduce fall risk.   OBJECTIVE IMPAIRMENTS: Abnormal gait, decreased activity tolerance, decreased balance, decreased endurance, difficulty walking, and decreased strength.   ACTIVITY LIMITATIONS: lifting, bending, squatting, stairs, and locomotion level  PARTICIPATION LIMITATIONS: cleaning, laundry, community activity, and yard work  PERSONAL FACTORS: Age, Behavior pattern, Past/current experiences, and Time since onset of injury/illness/exacerbation are also affecting patient's functional outcome.   REHAB POTENTIAL: Good  CLINICAL DECISION MAKING: Stable/uncomplicated  EVALUATION COMPLEXITY: Low   GOALS: Goals reviewed with patient? Yes  SHORT TERM GOALS: Target date: 03/18/2024  Patient will be independent in HEP to improve strength/mobility for better functional independence with ADLs.  Baseline: 02/19/24: to be initiated at visit #2 - has HHPT HEP.  02/21/24: Pt continuing home health exercises (list truncated today); [employer]  exercises added to supplement current program. Goal status: INITIAL  LONG TERM GOALS: Target date: 04/15/2024  Patient will increase lower extremity functional scale by 9 points to demonstrate improved functional mobility and increased tolerance with ADLs.   Baseline: 02/19/24: 33/80 Goal status: INITIAL  2.  Patient will increase BLE gross strength to 4+/5 as to improve functional strength for independent gait, increased standing tolerance and increased ADL ability.  Baseline: 02/19/24: see above  Goal status: INITIAL  3.  Patient will improve five times sit to stand test by 3 seconds indicating an increased LE strength and improved balance.  Baseline: 02/19/24: 14.81 seconds with no UE support  Goal status: INITIAL  4.  Patient will improve by 73m (164') in order to demonstrate clinically significant improvement in cardiopulmonary endurance and community ambulation  Baseline: 02/19/24: to be completed visit #2.   02/21/24: 770 ft Goal status: INITIAL  5.  Patient will increase 10 meter walk test to >1.29m/s as to improve gait speed for better community ambulation and to reduce fall risk.  Baseline: 02/19/24: to be completed visit #2.   02/21/24: 0.92 m/s Goal status: INITIAL   PLAN:  PT FREQUENCY: 1-2x/week  PT DURATION: 8 weeks  PLANNED INTERVENTIONS: 97164- PT Re-evaluation, 97750- Physical Performance Testing, 97110-Therapeutic exercises, 97530- Therapeutic activity, [id number]- Neuromuscular re-education, 97535- Self Care, 02859- Manual therapy, [id number]- Gait training, [phone]- Electrical stimulation (unattended), [phone]- Electrical stimulation (manual), 20560 (1-2 muscles), 20561 (3+ muscles)- Dry Needling, Patient/Family education, Balance training, Stair training, Joint mobilization, Joint manipulation, Cryotherapy, and Moist heat  PLAN FOR NEXT SESSION: Continue with dynamic balance training and postural control training. LE strengthening to improve capacity for outdoor walking and yard work.     Venetia Endo, PT, DPT [phone] 02/21/2024, 1:02 PM  "

## 2024-02-26 ENCOUNTER — Ambulatory Visit: Admitting: Physical Therapy

## 2024-02-26 ENCOUNTER — Encounter: Payer: Self-pay | Admitting: Physical Therapy

## 2024-02-26 DIAGNOSIS — M6281 Muscle weakness (generalized): Secondary | ICD-10-CM | POA: Diagnosis not present

## 2024-02-26 DIAGNOSIS — R2681 Unsteadiness on feet: Secondary | ICD-10-CM

## 2024-02-26 NOTE — Therapy (Signed)
 " OUTPATIENT PHYSICAL THERAPY LOWER EXTREMITY TREATMENT   Patient Name: Nicole Griffith MRN: [id number] DOB:1945/02/17, 79 y.o., female Today's Date: 02/26/2024  END OF SESSION:  PT End of Session - 02/26/24 1125     Visit Number 3    Number of Visits 17    Date for Recertification  04/15/24    PT Start Time 1119    PT Stop Time 1200    PT Time Calculation (min) 41 min    Equipment Utilized During Treatment Gait belt    Activity Tolerance Patient tolerated treatment well    Behavior During Therapy WFL for tasks assessed/performed            Past Medical History:  Diagnosis Date   Hypertension    Past Surgical History:  Procedure Laterality Date   ABDOMINAL HYSTERECTOMY     GALLBLADDER SURGERY     There are no active problems to display for this patient.   PCP: Kingston Mai, MD  REFERRING PROVIDER: Deloria Therisa BIRCH, MD   REFERRING DIAG: [phone] (ICD-10-CM) - Pain in left leg  THERAPY DIAG:  Muscle weakness (generalized)  Unsteadiness on feet  Rationale for Evaluation and Treatment: Rehabilitation  ONSET DATE: >1 month   SUBJECTIVE STATEMENT: Patient was admitted to [facility] and found to have L knee meniscal tear and herniated muscle pull in L lower leg. She received HH therapy prior to coming to OPPT which she said this seemed to help and pain is better. She doesn't quite understand why she is coming to [facility]PPT for more therapy as she feels her L knee is better and less pain.   PERTINENT HISTORY: Per PT note from 01/13/24 hospitalization, Nicole Griffith is a 79 y.o. female with PMH of HTN, GERD, and osteoarthritis presenting for ~2 weeks of persistent tightness and pain radiating down the her posterior left lower extremity localized to her posterior left knee. Per MRI results, pt with medial meniscal tear.  PAIN:  Are you having pain? No: NPRS scale: 0/10; No pain over the past week.    PRECAUTIONS: None  RED FLAGS: None   WEIGHT BEARING  RESTRICTIONS: No  FALLS:  Has patient fallen in last 6 months? No  LIVING ENVIRONMENT: Lives with: lives with their spouse Lives in: House/apartment  Has following equipment at home: Single point cane and Environmental Consultant - 2 wheeled  OCCUPATION: Retired   PLOF: Independent  PATIENT GOALS: to get stronger   OBJECTIVE:  Note: Objective measures were completed at Evaluation unless otherwise noted.  DIAGNOSTIC FINDINGS:   EXAM: MRI LOWER EXTREMITY JOINT LEFT WO CONTRAST IMPRESSION: Left knee: 1. Horizontal oblique tear of the body of the medial meniscus, with small meniscal fragment extending to the meniscotibial recess. 2. Mild tendinosis of the origin of the medial gastrocnemius. 3. Mild tricompartmental chondrosis. Tiny popliteal cyst.  PATIENT SURVEYS:  LEFS  Extreme difficulty/unable (0), Quite a bit of difficulty (1), Moderate difficulty (2), Little difficulty (3), No difficulty (4) Survey date:   02/19/24  Any of your usual work, housework or school activities 3  2. Usual hobbies, recreational or sporting activities 3  3. Getting into/out of the bath 2  4. Walking between rooms 2  5. Putting on socks/shoes 4  6. Squatting  3  7. Lifting an object, like a bag of groceries from the floor 1  8. Performing light activities around your home 1  9. Performing heavy activities around your home 1  10. Getting into/out of a car 3  11. Walking 2 blocks 3  12. Walking 1 mile 1  13. Going up/down 10 stairs (1 flight) 1  14. Standing for 1 hour 1  15.  sitting for 1 hour 2  16. Running on even ground 0  17. Running on uneven ground 0  18. Making sharp turns while running fast 0  19. Hopping  0  20. Rolling over in bed 2  Score total:  33/80     COGNITION: Overall cognitive status: Within functional limits for tasks assessed     SENSATION: WFL - does have hx of peripheral neuropathy per patient   MUSCLE LENGTH: Not tested  POSTURE: No Significant postural  limitations  PALPATION: No TTP   LOWER EXTREMITY ROM:  WFL   LOWER EXTREMITY MMT:  MMT Right eval Left eval  Hip flexion 4 4  Hip extension    Hip abduction 4 4  Hip adduction 4+ 4+  Hip internal rotation    Hip external rotation    Knee flexion 4+ 4+  Knee extension 4+ 4+  Ankle dorsiflexion 4+ 4+  Ankle plantarflexion    Ankle inversion    Ankle eversion     (Blank rows = not tested)  LOWER EXTREMITY SPECIAL TESTS:  Known L knee meniscal tear   FUNCTIONAL TESTS:  5 times sit to stand: 14.81 seconds with no UE support  6 minute walk test: 770 ft 10 meter walk test: 10 meters/10.91 seconds = 0.92 m/s  (Updated 02/21/24)   GAIT: Distance walked: 50'  Assistive device utilized: None Level of assistance: Modified independence Comments: slight R lateral lean, decreased stance time on L                                                                                                                                TREATMENT DATE: 02/26/2024    SUBJECTIVE STATEMENT:   Pt reports tolerating last visit well. Patient reports having hard time remembering home exercises - she did work on electronic data systems, standing with feet together, and some of the drills on her program.    Neuromuscular Re-education - for improved sensory integration, static and dynamic postural control, equilibrium and non-equilibrium coordination as needed for negotiating home and community environment and stepping over obstacles   // bars:  Toe tapping, 6-inch step; 1 x 10 alternating  Semitandem stance on Airex; 2 x 30 sec on each side   -1/2 Tandem with progression to 3/4 Tandem   Dynamic march along blue agility ladder with 4-lb ankle weights; 2x D/B with careful CGA  -MinA x 2 to re-gain balance following lateral LOB   *not today* Romberg: EO 30 sec, EC 30 sec   Therapeutic Exercise - improved strength as needed to improve performance of CKC activities/functional movements and as needed for power  production to prevent fall during episode of large postural perturbation   NuStep; Level 5, x 5 minutes - for improved soft tissue mobility and  increased tissue temperature to improve muscle performance   -subjective gathered during this time  -interval subjective gathered during this time  Forward step up, 6-inch step; 1 x 10 with bilat LE   Sidestep with Red Tband; 4x D/B, // bars  Sit to stand; 1 x 15, arms crossed over chest   PATIENT EDUCATION: Reviewed existing HEP and reminded pt of our truncating of HH exercise program last visit.   PATIENT EDUCATION:  Education details: see above Person educated: Patient Education method: Medical Illustrator Education comprehension: verbalized understanding and returned demonstration  HOME EXERCISE PROGRAM: Pt continuing home health exercises (minisquat, standing march, standing hip extension and abduction, LAQ, supine ankle pumps with feet elevated for edema control) Access Code: [email or web address] URL: [email or web address] Date: 02/21/2024 Prepared by: Venetia Endo  Exercises - Alternating Step Taps with Counter Support  - 2 x daily - 7 x weekly - 2 sets - 10 reps - Standing Romberg to 1/2 Tandem Stance  - 2 x daily - 7 x weekly - 2-3 sets - 30sec hold    ASSESSMENT:  CLINICAL IMPRESSION: Pt continued physical therapy plan of care with focus on lower extremity strengthening and strategies to address postural control/balance deficits found in initial evaluation. Pt is notably challenged with unipedal support phase of dynamic marching exercise, but she performs very well with progression of static balance training today. Pt has mild strain affecting anterior knee with sit to stand drill, but pt does not report substantial pain through session today. Pt has remaining deficits in postural control, gait instability, and LE strength. Patient will benefit from skilled PT interventions to address listed impairments to  improve quality of life and reduce fall risk.   OBJECTIVE IMPAIRMENTS: Abnormal gait, decreased activity tolerance, decreased balance, decreased endurance, difficulty walking, and decreased strength.   ACTIVITY LIMITATIONS: lifting, bending, squatting, stairs, and locomotion level  PARTICIPATION LIMITATIONS: cleaning, laundry, community activity, and yard work  PERSONAL FACTORS: Age, Behavior pattern, Past/current experiences, and Time since onset of injury/illness/exacerbation are also affecting patient's functional outcome.   REHAB POTENTIAL: Good  CLINICAL DECISION MAKING: Stable/uncomplicated  EVALUATION COMPLEXITY: Low   GOALS: Goals reviewed with patient? Yes  SHORT TERM GOALS: Target date: 03/18/2024  Patient will be independent in HEP to improve strength/mobility for better functional independence with ADLs.  Baseline: 02/19/24: to be initiated at visit #2 - has HHPT HEP.     02/21/24: Pt continuing home health exercises (list truncated today); MedBridge exercises added to supplement current program. Goal status: INITIAL  LONG TERM GOALS: Target date: 04/15/2024  Patient will increase lower extremity functional scale by 9 points to demonstrate improved functional mobility and increased tolerance with ADLs.   Baseline: 02/19/24: 33/80 Goal status: INITIAL  2.  Patient will increase BLE gross strength to 4+/5 as to improve functional strength for independent gait, increased standing tolerance and increased ADL ability.  Baseline: 02/19/24: see above  Goal status: INITIAL  3.  Patient will improve five times sit to stand test by 3 seconds indicating an increased LE strength and improved balance.  Baseline: 02/19/24: 14.81 seconds with no UE support  Goal status: INITIAL  4.  Patient will improve by 25m (164') in order to demonstrate clinically significant improvement in cardiopulmonary endurance and community ambulation  Baseline: 02/19/24: to be completed visit #2.   02/21/24: 770  ft Goal status: INITIAL  5.  Patient will increase 10 meter walk test to >1.21m/s as to improve gait speed for better community ambulation and  to reduce fall risk.  Baseline: 02/19/24: to be completed visit #2.   02/21/24: 0.92 m/s Goal status: INITIAL   PLAN:  PT FREQUENCY: 1-2x/week  PT DURATION: 8 weeks  PLANNED INTERVENTIONS: 97164- PT Re-evaluation, 97750- Physical Performance Testing, 97110-Therapeutic exercises, 97530- Therapeutic activity, [id number]- Neuromuscular re-education, 97535- Self Care, 02859- Manual therapy, [id number]- Gait training, [phone]- Electrical stimulation (unattended), [phone]- Electrical stimulation (manual), 20560 (1-2 muscles), 20561 (3+ muscles)- Dry Needling, Patient/Family education, Balance training, Stair training, Joint mobilization, Joint manipulation, Cryotherapy, and Moist heat  PLAN FOR NEXT SESSION: Continue with dynamic balance training and postural control training. LE strengthening to improve capacity for outdoor walking and yard work.    Venetia Endo, PT, DPT [phone] 02/26/2024, 11:25 AM  "

## 2024-02-28 ENCOUNTER — Encounter: Payer: Self-pay | Admitting: Physical Therapy

## 2024-02-28 ENCOUNTER — Ambulatory Visit: Admitting: Physical Therapy

## 2024-02-28 DIAGNOSIS — M6281 Muscle weakness (generalized): Secondary | ICD-10-CM

## 2024-02-28 DIAGNOSIS — R2681 Unsteadiness on feet: Secondary | ICD-10-CM

## 2024-02-28 NOTE — Therapy (Signed)
 " OUTPATIENT PHYSICAL THERAPY LOWER EXTREMITY TREATMENT   Patient Name: Nicole Griffith MRN: [id number] DOB:1945-12-30, 79 y.o., female Today's Date: 02/28/2024  END OF SESSION:  PT End of Session - 02/28/24 1240     Visit Number 4    Number of Visits 17    Date for Recertification  04/15/24    PT Start Time 1115    PT Stop Time 1211    PT Time Calculation (min) 56 min    Equipment Utilized During Treatment Gait belt    Activity Tolerance Patient tolerated treatment well    Behavior During Therapy WFL for tasks assessed/performed             Past Medical History:  Diagnosis Date   Hypertension    Past Surgical History:  Procedure Laterality Date   ABDOMINAL HYSTERECTOMY     GALLBLADDER SURGERY     There are no active problems to display for this patient.   PCP: Kingston Mai, MD  REFERRING PROVIDER: Deloria Therisa BIRCH, MD   REFERRING DIAG: [phone] (ICD-10-CM) - Pain in left leg  THERAPY DIAG:  Muscle weakness (generalized)  Unsteadiness on feet  Rationale for Evaluation and Treatment: Rehabilitation  ONSET DATE: >1 month   SUBJECTIVE STATEMENT: Patient was admitted to [facility] and found to have L knee meniscal tear and herniated muscle pull in L lower leg. She received HH therapy prior to coming to OPPT which she said this seemed to help and pain is better. She doesn't quite understand why she is coming to OPPT for more therapy as she feels her L knee is better and less pain.   PERTINENT HISTORY: Per PT note from 01/13/24 hospitalization, Nicole Griffith is a 79 y.o. female with PMH of HTN, GERD, and osteoarthritis presenting for ~2 weeks of persistent tightness and pain radiating down the her posterior left lower extremity localized to her posterior left knee. Per MRI results, pt with medial meniscal tear.  PAIN:  Are you having pain? No: NPRS scale: 0/10; No pain over the past week.    PRECAUTIONS: None  RED FLAGS: None   WEIGHT BEARING  RESTRICTIONS: No  FALLS:  Has patient fallen in last 6 months? No  LIVING ENVIRONMENT: Lives with: lives with their spouse Lives in: House/apartment  Has following equipment at home: Single point cane and Environmental Consultant - 2 wheeled  OCCUPATION: Retired   PLOF: Independent  PATIENT GOALS: to get stronger   OBJECTIVE:  Note: Objective measures were completed at Evaluation unless otherwise noted.  DIAGNOSTIC FINDINGS:   EXAM: MRI LOWER EXTREMITY JOINT LEFT WO CONTRAST IMPRESSION: Left knee: 1. Horizontal oblique tear of the body of the medial meniscus, with small meniscal fragment extending to the meniscotibial recess. 2. Mild tendinosis of the origin of the medial gastrocnemius. 3. Mild tricompartmental chondrosis. Tiny popliteal cyst.  PATIENT SURVEYS:  LEFS  Extreme difficulty/unable (0), Quite a bit of difficulty (1), Moderate difficulty (2), Little difficulty (3), No difficulty (4) Survey date:   02/19/24  Any of your usual work, housework or school activities 3  2. Usual hobbies, recreational or sporting activities 3  3. Getting into/out of the bath 2  4. Walking between rooms 2  5. Putting on socks/shoes 4  6. Squatting  3  7. Lifting an object, like a bag of groceries from the floor 1  8. Performing light activities around your home 1  9. Performing heavy activities around your home 1  10. Getting into/out of a car 3  11. Walking 2 blocks 3  12. Walking 1 mile 1  13. Going up/down 10 stairs (1 flight) 1  14. Standing for 1 hour 1  15.  sitting for 1 hour 2  16. Running on even ground 0  17. Running on uneven ground 0  18. Making sharp turns while running fast 0  19. Hopping  0  20. Rolling over in bed 2  Score total:  33/80     COGNITION: Overall cognitive status: Within functional limits for tasks assessed     SENSATION: WFL - does have hx of peripheral neuropathy per patient   MUSCLE LENGTH: Not tested  POSTURE: No Significant postural  limitations  PALPATION: No TTP   LOWER EXTREMITY ROM:  WFL   LOWER EXTREMITY MMT:  MMT Right eval Left eval  Hip flexion 4 4  Hip extension    Hip abduction 4 4  Hip adduction 4+ 4+  Hip internal rotation    Hip external rotation    Knee flexion 4+ 4+  Knee extension 4+ 4+  Ankle dorsiflexion 4+ 4+  Ankle plantarflexion    Ankle inversion    Ankle eversion     (Blank rows = not tested)  LOWER EXTREMITY SPECIAL TESTS:  Known L knee meniscal tear   FUNCTIONAL TESTS:  5 times sit to stand: 14.81 seconds with no UE support  6 minute walk test: 770 ft 10 meter walk test: 10 meters/10.91 seconds = 0.92 m/s  (Updated 02/21/24)   GAIT: Distance walked: 50'  Assistive device utilized: None Level of assistance: Modified independence Comments: slight R lateral lean, decreased stance time on L                                                                                                                                TREATMENT DATE: 02/28/2024    SUBJECTIVE STATEMENT: Pt reports doing well today, although she reports R ankle being sore after last session, possibly due to the shoes was wearing. Pt believes that she is overwhelmed with the amount of HEP, our session today will look those over and cut down on some. Pt reports no pain or discomfort upon arrival.   Neuromuscular Re-education - for improved sensory integration, static and dynamic postural control, equilibrium and non-equilibrium coordination as needed for negotiating home and community environment and stepping over obstacles   // bars:  Toe tapping, 6-inch step; 1 x 10 alternating   Semitandem stance on Airex; 1 x 30 sec on each side Tandem stance on Airex; 1 x 30 sec on each side -Pt did well with full tandem, required some MinA   Dynamic march along blue agility ladder with 4-lb ankle weights; 3x D/B with careful CGA   *not today* Romberg: EO 30 sec, EC 30 sec   Therapeutic Exercise - improved  strength as needed to improve performance of CKC activities/functional movements and as needed for power production to prevent fall during  episode of large postural perturbation   NuStep; Level 5, x 5 minutes - for improved soft tissue mobility and increased tissue temperature to improve muscle performance   -subjective gathered during this time  -interval subjective gathered during this time  Forward step up, 6-inch step; 1 x 10 with bilat LE   Sidestep with Red Tband; 4x D/B, // bars  Sit to stand; 2 x 15, arms crossed over chest  -cued to not press the back of knees against the blue mat  PATIENT EDUCATION: Reviewed existing HEP and reminded pt of our truncating of HH exercise program last visit.   PATIENT EDUCATION:  Education details: see above Person educated: Patient Education method: Medical Illustrator Education comprehension: verbalized understanding and returned demonstration  HOME EXERCISE PROGRAM: Access Code: [email or web address] URL: [email or web address] Date: 02/28/2024 Prepared by: Venetia Endo  Exercises - Alternating Step Taps with Counter Support  - 2 x daily - 7 x weekly - 2 sets - 10 reps - Standing Romberg to 1/2 Tandem Stance  - 2 x daily - 7 x weekly - 2-3 sets - 30sec hold - Standing Double Leg Mini Squat  - 2 x daily - 7 x weekly - 2 sets - 10 reps - Seated Long Arc Quad  - 2 x daily - 7 x weekly - 2 sets - 10 reps - Standing March with Counter Support  - 2 x daily - 7 x weekly - 2 sets - 10 reps - Heel Toe Raises with Counter Support  - 2 x daily - 7 x weekly - 2 sets - 10 reps - Standing Hip Abduction with Counter Support  - 2 x daily - 7 x weekly - 2 sets - 10 reps - Standing Hip Extension with Counter Support  - 2 x daily - 7 x weekly - 2 sets - 10 reps    ASSESSMENT:  CLINICAL IMPRESSION: Pt did great in today's session, she was able to progress to a full tandem stance on uneven surface showing improvement with balance on  uneven surfaces. She has good rehab potential and is showing improvement with balance and LE strength. Next sessions will focus on progressing balance and LE strength. Reviewed patient's HEP from home health and [facility] and created a combined plan for convenience. Educated patient on use of online service for MedBridge. Patient will benefit from skilled PT interventions to address listed impairments to improve quality of life and reduce fall risk.   OBJECTIVE IMPAIRMENTS: Abnormal gait, decreased activity tolerance, decreased balance, decreased endurance, difficulty walking, and decreased strength.   ACTIVITY LIMITATIONS: lifting, bending, squatting, stairs, and locomotion level  PARTICIPATION LIMITATIONS: cleaning, laundry, community activity, and yard work  PERSONAL FACTORS: Age, Behavior pattern, Past/current experiences, and Time since onset of injury/illness/exacerbation are also affecting patient's functional outcome.   REHAB POTENTIAL: Good  CLINICAL DECISION MAKING: Stable/uncomplicated  EVALUATION COMPLEXITY: Low   GOALS: Goals reviewed with patient? Yes  SHORT TERM GOALS: Target date: 03/18/2024  Patient will be independent in HEP to improve strength/mobility for better functional independence with ADLs.  Baseline: 02/19/24: to be initiated at visit #2 - has HHPT HEP.     02/21/24: Pt continuing home health exercises (list truncated today); MedBridge exercises added to supplement current program. Goal status: INITIAL  LONG TERM GOALS: Target date: 04/15/2024  Patient will increase lower extremity functional scale by 9 points to demonstrate improved functional mobility and increased tolerance with ADLs.   Baseline: 02/19/24: 33/80 Goal status: INITIAL  2.  Patient will increase BLE gross strength to 4+/5 as to improve functional strength for independent gait, increased standing tolerance and increased ADL ability.  Baseline: 02/19/24: see above  Goal status: INITIAL  3.  Patient  will improve five times sit to stand test by 3 seconds indicating an increased LE strength and improved balance.  Baseline: 02/19/24: 14.81 seconds with no UE support  Goal status: INITIAL  4.  Patient will improve by 60m (164') in order to demonstrate clinically significant improvement in cardiopulmonary endurance and community ambulation  Baseline: 02/19/24: to be completed visit #2.   02/21/24: 770 ft Goal status: INITIAL  5.  Patient will increase 10 meter walk test to >1.67m/s as to improve gait speed for better community ambulation and to reduce fall risk.  Baseline: 02/19/24: to be completed visit #2.   02/21/24: 0.92 m/s Goal status: INITIAL   PLAN:  PT FREQUENCY: 1-2x/week  PT DURATION: 8 weeks  PLANNED INTERVENTIONS: 97164- PT Re-evaluation, 97750- Physical Performance Testing, 97110-Therapeutic exercises, 97530- Therapeutic activity, [id number]- Neuromuscular re-education, 97535- Self Care, 02859- Manual therapy, [id number]- Gait training, [phone]- Electrical stimulation (unattended), [phone]- Electrical stimulation (manual), 20560 (1-2 muscles), 20561 (3+ muscles)- Dry Needling, Patient/Family education, Balance training, Stair training, Joint mobilization, Joint manipulation, Cryotherapy, and Moist heat  PLAN FOR NEXT SESSION: Continue with dynamic balance training and postural control training. LE strengthening to improve capacity for outdoor walking and yard work.    Huston Band, SPT 02/28/2024  12:43 PM  "

## 2024-03-04 ENCOUNTER — Ambulatory Visit: Admitting: Physical Therapy

## 2024-03-06 ENCOUNTER — Ambulatory Visit: Admitting: Physical Therapy

## 2024-03-11 ENCOUNTER — Ambulatory Visit: Admitting: Physical Therapy

## 2024-03-13 ENCOUNTER — Ambulatory Visit: Admitting: Physical Therapy

## 2024-03-13 NOTE — Therapy (Unsigned)
 " OUTPATIENT PHYSICAL THERAPY LOWER EXTREMITY TREATMENT   Patient Name: Nicole Griffith MRN: [id number] DOB:03/07/1945, 79 y.o., female Today's Date: 03/13/2024  END OF SESSION:       Past Medical History:  Diagnosis Date   Hypertension    Past Surgical History:  Procedure Laterality Date   ABDOMINAL HYSTERECTOMY     GALLBLADDER SURGERY     There are no active problems to display for this patient.   PCP: Kingston Mai, MD  REFERRING PROVIDER: Deloria Therisa BIRCH, MD   REFERRING DIAG: [phone] (ICD-10-CM) - Pain in left leg  THERAPY DIAG:  Muscle weakness (generalized)  Unsteadiness on feet  Rationale for Evaluation and Treatment: Rehabilitation  ONSET DATE: >1 month   SUBJECTIVE STATEMENT: Patient was admitted to [facility] and found to have L knee meniscal tear and herniated muscle pull in L lower leg. She received HH therapy prior to coming to [facility] which she said this seemed to help and pain is better. She doesn't quite understand why she is coming to [facility] for more therapy as she feels her L knee is better and less pain.   PERTINENT HISTORY: Per PT note from 01/13/24 hospitalization, Nicole Griffith is a 79 y.o. female with PMH of HTN, GERD, and osteoarthritis presenting for ~2 weeks of persistent tightness and pain radiating down the her posterior left lower extremity localized to her posterior left knee. Per MRI results, pt with medial meniscal tear.  PAIN:  Are you having pain? No: NPRS scale: 0/10; No pain over the past week.    PRECAUTIONS: None  RED FLAGS: None   WEIGHT BEARING RESTRICTIONS: No  FALLS:  Has patient fallen in last 6 months? No  LIVING ENVIRONMENT: Lives with: lives with their spouse Lives in: House/apartment  Has following equipment at home: Single point cane and Environmental Consultant - 2 wheeled  OCCUPATION: Retired   PLOF: Independent  PATIENT GOALS: to get stronger   OBJECTIVE:  Note: Objective measures were completed at  Evaluation unless otherwise noted.  DIAGNOSTIC FINDINGS:   EXAM: MRI LOWER EXTREMITY JOINT LEFT WO CONTRAST IMPRESSION: Left knee: 1. Horizontal oblique tear of the body of the medial meniscus, with small meniscal fragment extending to the meniscotibial recess. 2. Mild tendinosis of the origin of the medial gastrocnemius. 3. Mild tricompartmental chondrosis. Tiny popliteal cyst.  PATIENT SURVEYS:  LEFS  Extreme difficulty/unable (0), Quite a bit of difficulty (1), Moderate difficulty (2), Little difficulty (3), No difficulty (4) Survey date:   02/19/24  Any of your usual work, housework or school activities 3  2. Usual hobbies, recreational or sporting activities 3  3. Getting into/out of the bath 2  4. Walking between rooms 2  5. Putting on socks/shoes 4  6. Squatting  3  7. Lifting an object, like a bag of groceries from the floor 1  8. Performing light activities around your home 1  9. Performing heavy activities around your home 1  10. Getting into/out of a car 3  11. Walking 2 blocks 3  12. Walking 1 mile 1  13. Going up/down 10 stairs (1 flight) 1  14. Standing for 1 hour 1  15.  sitting for 1 hour 2  16. Running on even ground 0  17. Running on uneven ground 0  18. Making sharp turns while running fast 0  19. Hopping  0  20. Rolling over in bed 2  Score total:  33/80     COGNITION: Overall cognitive status: Within functional limits  for tasks assessed     SENSATION: WFL - does have hx of peripheral neuropathy per patient   MUSCLE LENGTH: Not tested  POSTURE: No Significant postural limitations  PALPATION: No TTP   LOWER EXTREMITY ROM:  WFL   LOWER EXTREMITY MMT:  MMT Right eval Left eval  Hip flexion 4 4  Hip extension    Hip abduction 4 4  Hip adduction 4+ 4+  Hip internal rotation    Hip external rotation    Knee flexion 4+ 4+  Knee extension 4+ 4+  Ankle dorsiflexion 4+ 4+  Ankle plantarflexion    Ankle inversion    Ankle eversion      (Blank rows = not tested)  LOWER EXTREMITY SPECIAL TESTS:  Known L knee meniscal tear   FUNCTIONAL TESTS:  5 times sit to stand: 14.81 seconds with no UE support  6 minute walk test: 770 ft 10 meter walk test: 10 meters/10.91 seconds = 0.92 m/s  (Updated 02/21/24)   GAIT: Distance walked: 50'  Assistive device utilized: None Level of assistance: Modified independence Comments: slight R lateral lean, decreased stance time on L                                                                                                                                TREATMENT DATE: 03/13/24    SUBJECTIVE STATEMENT:  *** Pt reports doing well today, although she reports R ankle being sore after last session, possibly due to the shoes was wearing. Pt believes that she is overwhelmed with the amount of HEP, our session today will look those over and cut down on some. Pt reports no pain or discomfort upon arrival.   Neuromuscular Re-education - for improved sensory integration, static and dynamic postural control, equilibrium and non-equilibrium coordination as needed for negotiating home and community environment and stepping over obstacles   // bars:  Toe tapping, 6-inch step; 1 x 10 alternating   Semitandem stance on Airex; 1 x 30 sec on each side Tandem stance on Airex; 1 x 30 sec on each side -Pt did well with full tandem, required some MinA   Dynamic march along blue agility ladder with 4-lb ankle weights; 3x D/B with careful CGA   *not today* Romberg: EO 30 sec, EC 30 sec   Therapeutic Exercise - improved strength as needed to improve performance of CKC activities/functional movements and as needed for power production to prevent fall during episode of large postural perturbation   NuStep; Level 5, x 5 minutes - for improved soft tissue mobility and increased tissue temperature to improve muscle performance   -subjective gathered during this time  -interval subjective gathered during  this time  Forward step up, 6-inch step; 1 x 10 with bilat LE   Sidestep with Red Tband; 4x D/B, // bars  Sit to stand; 2 x 15, arms crossed over chest  -cued to not press the back of knees against  the blue mat  PATIENT EDUCATION: Reviewed existing HEP and reminded pt of our truncating of HH exercise program last visit.   PATIENT EDUCATION:  Education details: see above Person educated: Patient Education method: Medical Illustrator Education comprehension: verbalized understanding and returned demonstration  HOME EXERCISE PROGRAM: Access Code: [email or web address] URL: [email or web address] Date: 02/28/2024 Prepared by: Venetia Endo  Exercises - Alternating Step Taps with Counter Support  - 2 x daily - 7 x weekly - 2 sets - 10 reps - Standing Romberg to 1/2 Tandem Stance  - 2 x daily - 7 x weekly - 2-3 sets - 30sec hold - Standing Double Leg Mini Squat  - 2 x daily - 7 x weekly - 2 sets - 10 reps - Seated Long Arc Quad  - 2 x daily - 7 x weekly - 2 sets - 10 reps - Standing March with Counter Support  - 2 x daily - 7 x weekly - 2 sets - 10 reps - Heel Toe Raises with Counter Support  - 2 x daily - 7 x weekly - 2 sets - 10 reps - Standing Hip Abduction with Counter Support  - 2 x daily - 7 x weekly - 2 sets - 10 reps - Standing Hip Extension with Counter Support  - 2 x daily - 7 x weekly - 2 sets - 10 reps    ASSESSMENT:  CLINICAL IMPRESSION: Pt did great in today's session, she was able to progress to a full tandem stance on uneven surface showing improvement with balance on uneven surfaces. She has good rehab potential and is showing improvement with balance and LE strength. Next sessions will focus on progressing balance and LE strength. Reviewed patient's HEP from home health and [facility] and created a combined plan for convenience. Educated patient on use of online service for MedBridge. Patient will benefit from skilled PT interventions to address listed  impairments to improve quality of life and reduce fall risk.   OBJECTIVE IMPAIRMENTS: Abnormal gait, decreased activity tolerance, decreased balance, decreased endurance, difficulty walking, and decreased strength.   ACTIVITY LIMITATIONS: lifting, bending, squatting, stairs, and locomotion level  PARTICIPATION LIMITATIONS: cleaning, laundry, community activity, and yard work  PERSONAL FACTORS: Age, Behavior pattern, Past/current experiences, and Time since onset of injury/illness/exacerbation are also affecting patient's functional outcome.   REHAB POTENTIAL: Good  CLINICAL DECISION MAKING: Stable/uncomplicated  EVALUATION COMPLEXITY: Low   GOALS: Goals reviewed with patient? Yes  SHORT TERM GOALS: Target date: 03/18/2024  Patient will be independent in HEP to improve strength/mobility for better functional independence with ADLs.  Baseline: 02/19/24: to be initiated at visit #2 - has HHPT HEP.     02/21/24: Pt continuing home health exercises (list truncated today); MedBridge exercises added to supplement current program. Goal status: INITIAL  LONG TERM GOALS: Target date: 04/15/2024  Patient will increase lower extremity functional scale by 9 points to demonstrate improved functional mobility and increased tolerance with ADLs.   Baseline: 02/19/24: 33/80 Goal status: INITIAL  2.  Patient will increase BLE gross strength to 4+/5 as to improve functional strength for independent gait, increased standing tolerance and increased ADL ability.  Baseline: 02/19/24: see above  Goal status: INITIAL  3.  Patient will improve five times sit to stand test by 3 seconds indicating an increased LE strength and improved balance.  Baseline: 02/19/24: 14.81 seconds with no UE support  Goal status: INITIAL  4.  Patient will improve by 108m (164') in order to demonstrate clinically significant  improvement in cardiopulmonary endurance and community ambulation  Baseline: 02/19/24: to be completed visit #2.    02/21/24: 770 ft Goal status: INITIAL  5.  Patient will increase 10 meter walk test to >1.72m/s as to improve gait speed for better community ambulation and to reduce fall risk.  Baseline: 02/19/24: to be completed visit #2.   02/21/24: 0.92 m/s Goal status: INITIAL   PLAN:  PT FREQUENCY: 1-2x/week  PT DURATION: 8 weeks  PLANNED INTERVENTIONS: 97164- PT Re-evaluation, 97750- Physical Performance Testing, 97110-Therapeutic exercises, 97530- Therapeutic activity, [id number]- Neuromuscular re-education, 97535- Self Care, [id number]- Manual therapy, [id number]- Gait training, [phone]- Electrical stimulation (unattended), [phone]- Electrical stimulation (manual), 20560 (1-2 muscles), 20561 (3+ muscles)- Dry Needling, Patient/Family education, Balance training, Stair training, Joint mobilization, Joint manipulation, Cryotherapy, and Moist heat  PLAN FOR NEXT SESSION: Continue with dynamic balance training and postural control training. LE strengthening to improve capacity for outdoor walking and yard work.    Venetia Endo, PT, DPT [phone] [facility] 03/13/2024 7:35 AM  "

## 2024-03-18 ENCOUNTER — Ambulatory Visit: Admitting: Physical Therapy

## 2024-03-20 ENCOUNTER — Ambulatory Visit

## 2024-03-25 ENCOUNTER — Ambulatory Visit

## 2024-03-27 ENCOUNTER — Ambulatory Visit: Admitting: Physical Therapy
